# Patient Record
Sex: Male | Born: 1985 | Race: White | Hispanic: No | Marital: Married | Smoking: Never smoker
Health system: Southern US, Community
[De-identification: ages and names within clinical notes are randomized; demographics above are authoritative.]

## PROBLEM LIST (undated history)

## (undated) DIAGNOSIS — Q249 Congenital malformation of heart, unspecified: Secondary | ICD-10-CM

## (undated) DIAGNOSIS — M62838 Other muscle spasm: Secondary | ICD-10-CM

## (undated) HISTORY — DX: Other muscle spasm: M62.838

## (undated) HISTORY — DX: Congenital malformation of heart, unspecified: Q24.9

---

## 2010-12-04 ENCOUNTER — Emergency Department: Payer: Self-pay | Admitting: Emergency Medicine

## 2012-01-25 IMAGING — CR DG CHEST 2V
2 series · 2 of 2 positions shown · non-contrast
Comparison: none

REASON FOR EXAM: fevers, productive cough
COMMENTS:

PROCEDURE:     DXR - DXR CHEST PA (OR AP) AND LATERAL  - December 04, 2010  [DATE]
RESULT:     Comparison: None.

[pa]
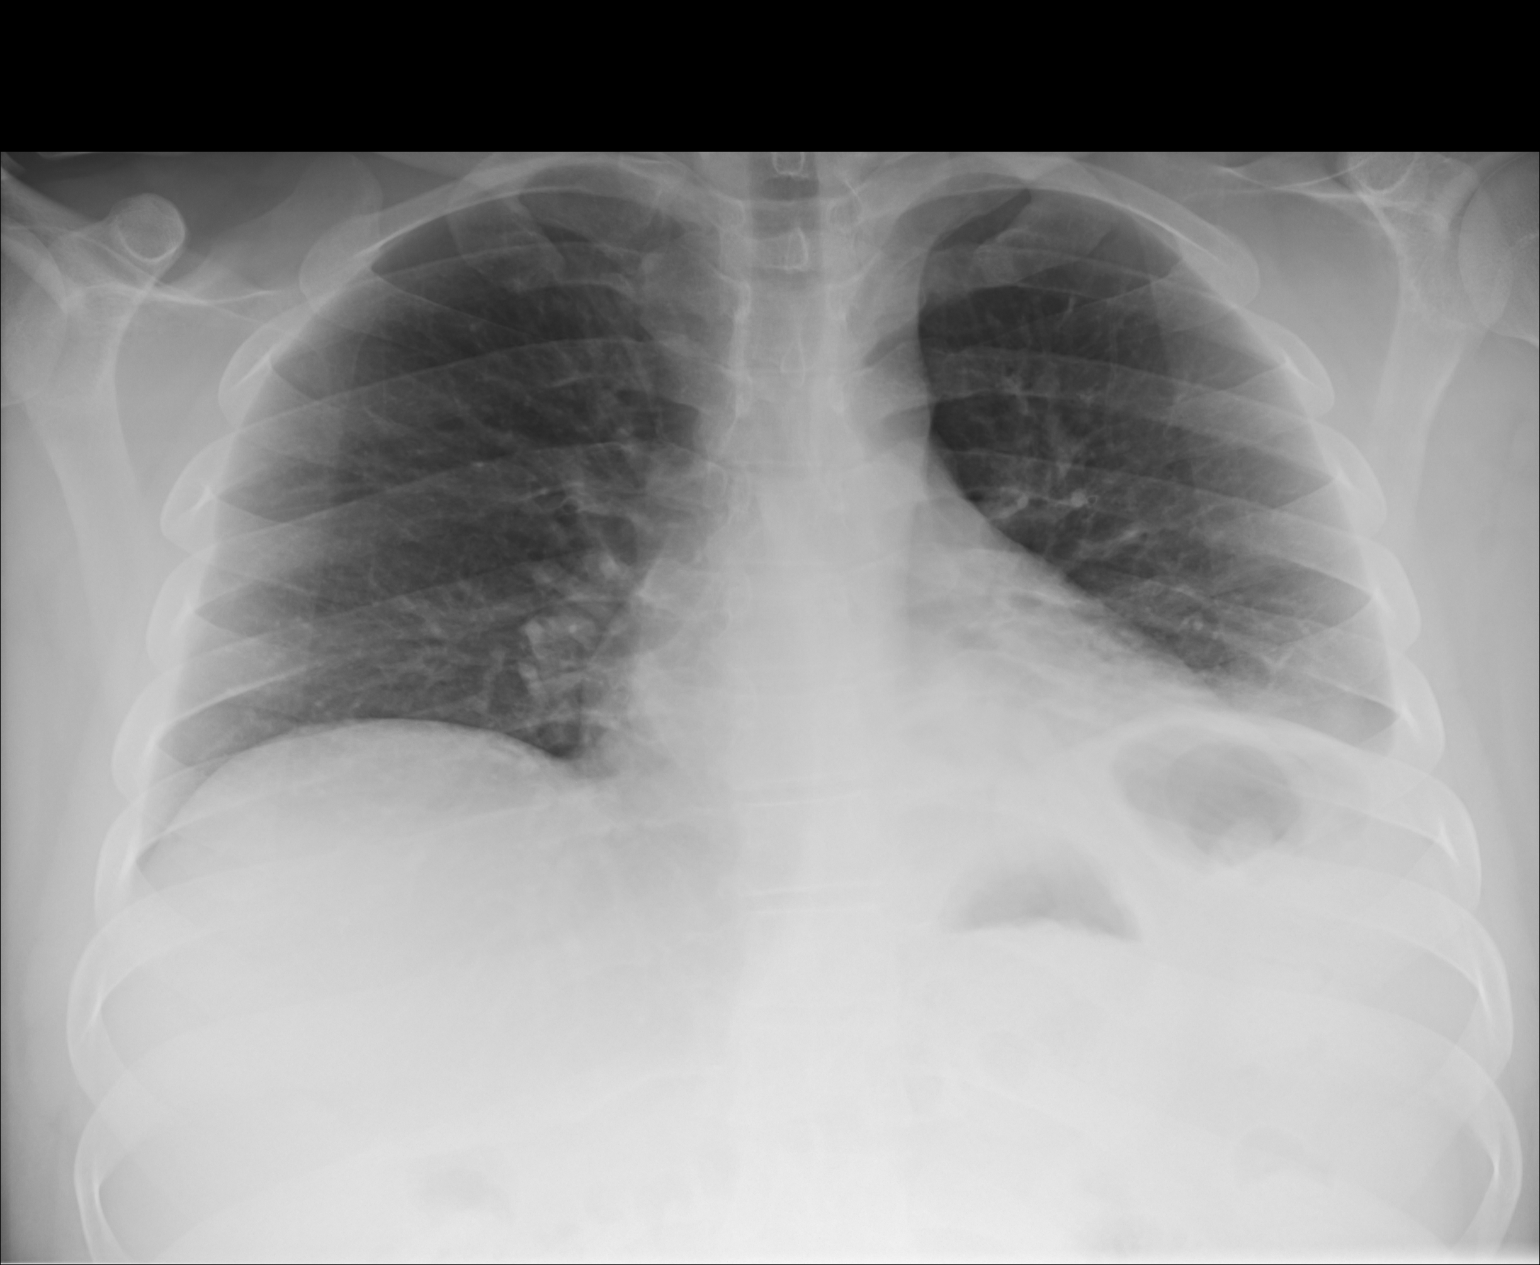

[lat]
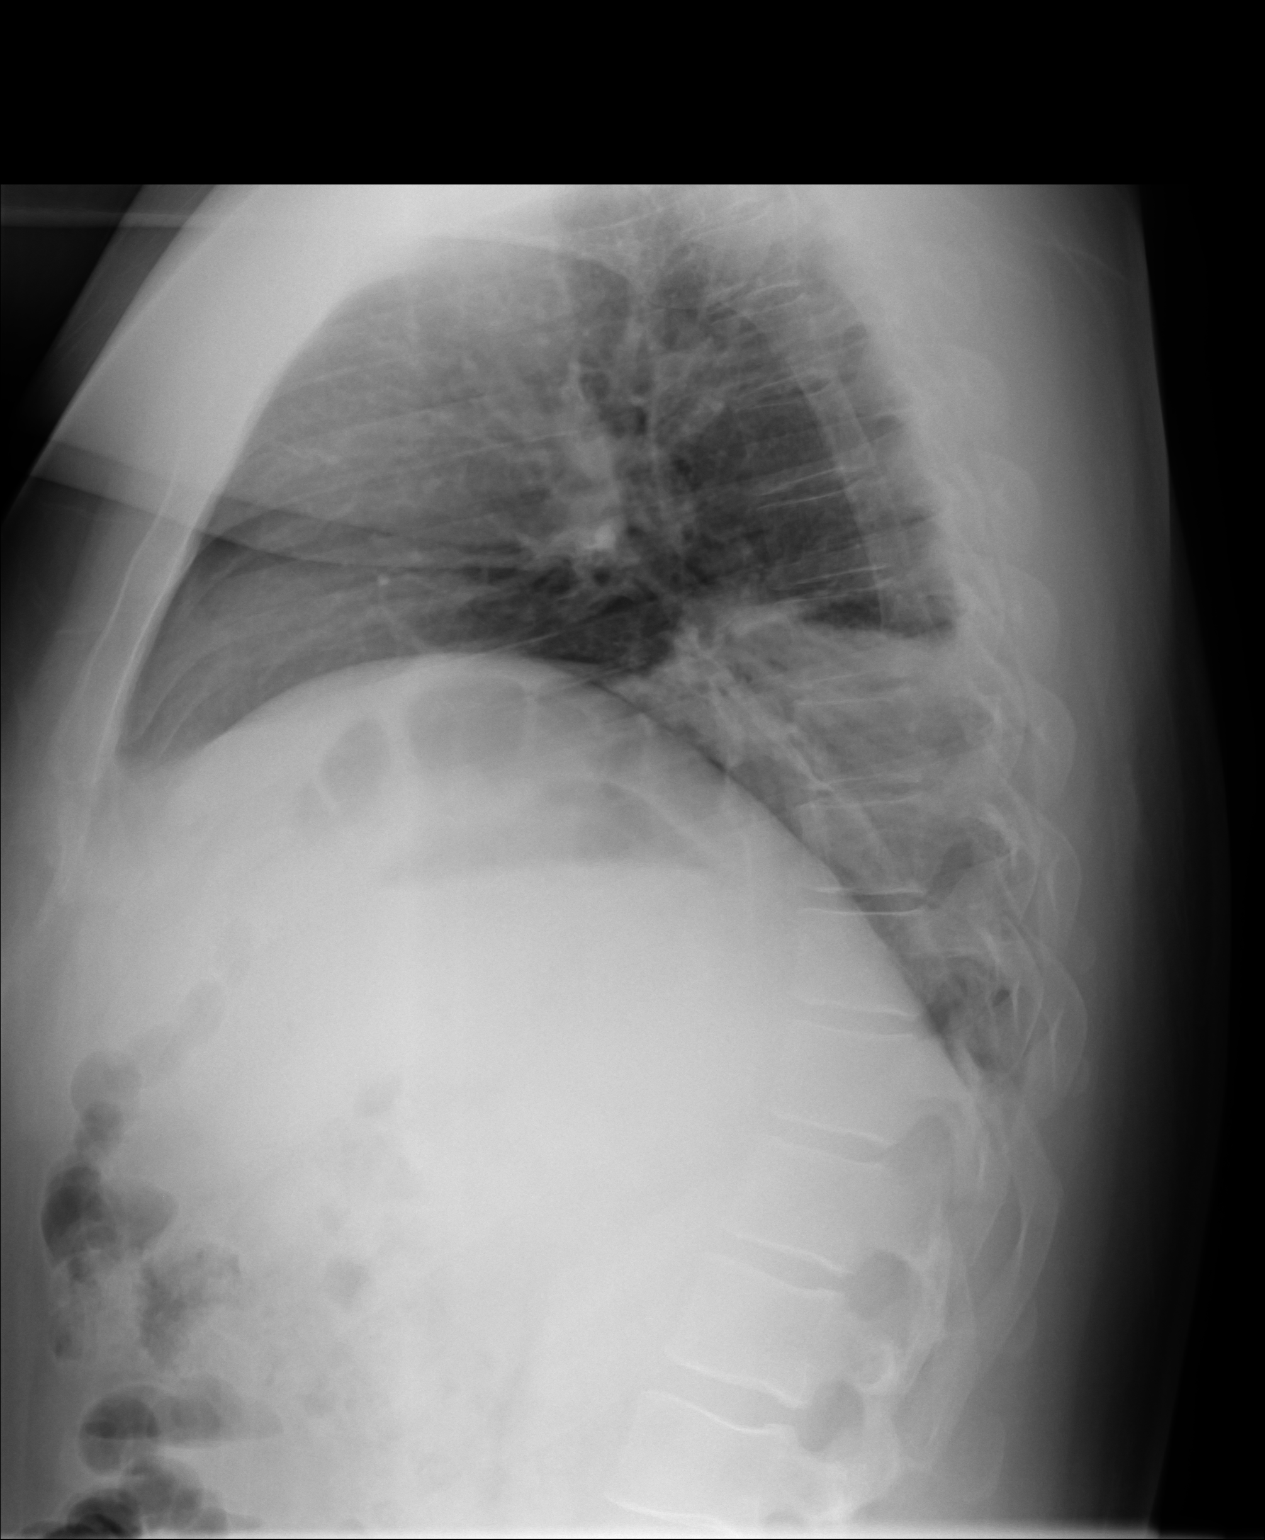

[2 of 2 positions shown; findings below may reference images not displayed]

FINDINGS: The lung volumes are low. Heart and mediastinum are within normal limits.
There is relatively homogeneous opacity in the left lower lobe, best seen on
the lateral view. The right lung is clear.
IMPRESSION: Left lower lobe pneumonia.

## 2018-04-18 ENCOUNTER — Other Ambulatory Visit: Payer: Self-pay | Admitting: Podiatry

## 2018-04-18 ENCOUNTER — Ambulatory Visit: Payer: BLUE CROSS/BLUE SHIELD | Admitting: Podiatry

## 2018-04-18 ENCOUNTER — Encounter: Payer: Self-pay | Admitting: Podiatry

## 2018-04-18 ENCOUNTER — Ambulatory Visit (INDEPENDENT_AMBULATORY_CARE_PROVIDER_SITE_OTHER): Payer: BLUE CROSS/BLUE SHIELD

## 2018-04-18 ENCOUNTER — Other Ambulatory Visit: Payer: Self-pay

## 2018-04-18 VITALS — BP 128/93 | HR 93 | Temp 97.7°F | Resp 16

## 2018-04-18 DIAGNOSIS — M7661 Achilles tendinitis, right leg: Secondary | ICD-10-CM | POA: Diagnosis not present

## 2018-04-18 DIAGNOSIS — M79672 Pain in left foot: Secondary | ICD-10-CM

## 2018-04-18 DIAGNOSIS — M7662 Achilles tendinitis, left leg: Secondary | ICD-10-CM

## 2018-04-18 DIAGNOSIS — M79671 Pain in right foot: Secondary | ICD-10-CM | POA: Diagnosis not present

## 2018-04-18 MED ORDER — TRIAMCINOLONE ACETONIDE 10 MG/ML IJ SUSP
10.0000 mg | Freq: Once | INTRAMUSCULAR | Status: AC
  Administered 2018-04-18: 10 mg

## 2018-04-18 NOTE — Patient Instructions (Signed)

## 2018-04-18 NOTE — Progress Notes (Signed)
   Subjective:    Patient ID: Stephen Ross, male    DOB: Mar 13, 1985, 33 y.o.   MRN: [id number]  HPI    Review of Systems  All other systems reviewed and are negative.      Objective:   Physical Exam        Assessment & Plan:

## 2018-04-18 NOTE — Progress Notes (Signed)
   Subjective:    Patient ID: Stephen Ross, male    DOB: June 29, 1985, 33 y.o.   MRN: [id number]  HPI    Review of Systems     Objective:   Physical Exam        Assessment & Plan:

## 2018-04-18 NOTE — Progress Notes (Signed)
Subjective:   Patient ID: Stephen Ross, male   DOB: 33 y.o.   MRN: [id number]   HPI Patient presents with a long-term pain in the posterior heel region left over right that is become more acute recently.  Patient is trying to lose weight and is obese and knows that he needs to drop weight but it is hard because of the pain in his heel and states that he has had trouble for years but it is just gotten worse as time is gone on.  Patient has been on anti-inflammatory which helped him to a minimal degree.  Patient does not smoke and likes to be active   Review of Systems  All other systems reviewed and are negative.       Objective:  Physical Exam Vitals signs and nursing note reviewed.  Constitutional:      Appearance: He is well-developed.  Pulmonary:     Effort: Pulmonary effort is normal.  Musculoskeletal: Normal range of motion.  Skin:    General: Skin is warm.  Neurological:     Mental Status: He is alert.     Neurovascular status found to be intact muscle strength is adequate range of motion within normal limits with patient found to have exquisite discomfort lateral side left posterior heel with inflammation fluid buildup and on the right I noted mild discomfort in the posterior heel.  Patient has severe equinus condition which is also contributory to his problem with obesity noted.  Patient has good digital perfusion well oriented x3     Assessment:  Posterior acute Achilles tendinitis left over right with patient also noted to have equinus condition bilateral which is contributory along with obesity     Plan:  H&P education rendered and discussed that at one point this may require surgery.  At this point will get a try to avoid surgery and I explained the procedure to him and at this time I went ahead and I explained chances for rupture associated with injection.  I did a careful lateral injection left 3 mg Dexasone Kenalog 5 mg Xylocaine applied air fracture walker for  complete rest and dispensed heel lifts.  Advised on ice reappoint 3 weeks or we may try the right and ultimately we may have to do surgical intervention along with Achilles tendon or gastroc lengthening  X-rays indicate there is relatively large spur formation posterior heel bilateral

## 2018-04-22 ENCOUNTER — Telehealth: Payer: Self-pay | Admitting: Podiatry

## 2018-04-22 NOTE — Telephone Encounter (Signed)
I informed Stephen Ross Dr. Charlsie Merles would want him to be in the boot until seen again in 2-4 weeks, except to sleep or shower. Stephen Ross asked if it was to still be painful after the injection and I told him on occasion there may be a flare of symptoms and treat with rest, ice and the antiinflammatory medication.

## 2018-04-22 NOTE — Telephone Encounter (Signed)
Pt seen Dr. Charlsie Merles on Monday. They received and injection and was put in a boot. Patient wanted to know how long they should have it on. Please call patient

## 2018-05-09 ENCOUNTER — Other Ambulatory Visit: Payer: Self-pay

## 2018-05-09 ENCOUNTER — Ambulatory Visit: Payer: BLUE CROSS/BLUE SHIELD | Admitting: Podiatry

## 2018-05-09 ENCOUNTER — Encounter: Payer: Self-pay | Admitting: Podiatry

## 2018-05-09 VITALS — Temp 98.8°F

## 2018-05-09 DIAGNOSIS — M7661 Achilles tendinitis, right leg: Secondary | ICD-10-CM | POA: Diagnosis not present

## 2018-05-09 DIAGNOSIS — M7662 Achilles tendinitis, left leg: Secondary | ICD-10-CM

## 2018-05-09 MED ORDER — TRIAMCINOLONE ACETONIDE 10 MG/ML IJ SUSP
10.0000 mg | Freq: Once | INTRAMUSCULAR | Status: AC
  Administered 2018-05-09: 10 mg

## 2018-05-09 NOTE — Progress Notes (Signed)
Subjective:   Patient ID: Stephen Ross, male   DOB: 33 y.o.   MRN: [id number]   HPI Patient presents stating right heel is some better but is still very tender and my right one has really been bothering me.  Patient realizes at one point this may need correction but he wants to try to see if the other 1 can be worked on and whether or not this will help his condition.  Patient at this point is aware surgery is a very distinct possibility   ROS      Objective:  Physical Exam  Neurovascular status intact with exquisite discomfort posterior aspect heel bilateral repaired though more intense on the right over left but both of them quite sore     Assessment:  Acute Achilles tendinitis right over left     Plan:  HEP condition reviewed and I discussed injection right explaining procedure and risk and he wants procedure.  I did a sterile prep the lateral side I injected the Achilles 3 mg Dexasone 5 g Xylocaine being careful not to put it into the central medial and I did discuss with him prior to doing it again like we had discussed previously the possibilities for rupture.  Patient will be seen back and understands may require surgery when it is also been done if symptoms get worse again

## 2018-06-01 ENCOUNTER — Other Ambulatory Visit: Payer: Self-pay

## 2018-06-01 ENCOUNTER — Encounter (INDEPENDENT_AMBULATORY_CARE_PROVIDER_SITE_OTHER): Payer: Self-pay

## 2018-06-01 ENCOUNTER — Encounter: Payer: Self-pay | Admitting: Podiatry

## 2018-06-01 ENCOUNTER — Ambulatory Visit (INDEPENDENT_AMBULATORY_CARE_PROVIDER_SITE_OTHER): Payer: BLUE CROSS/BLUE SHIELD | Admitting: Podiatry

## 2018-06-01 VITALS — Temp 98.1°F

## 2018-06-01 DIAGNOSIS — M7662 Achilles tendinitis, left leg: Secondary | ICD-10-CM

## 2018-06-01 DIAGNOSIS — M7661 Achilles tendinitis, right leg: Secondary | ICD-10-CM | POA: Diagnosis not present

## 2018-06-01 DIAGNOSIS — M216X2 Other acquired deformities of left foot: Secondary | ICD-10-CM

## 2018-06-01 DIAGNOSIS — M216X1 Other acquired deformities of right foot: Secondary | ICD-10-CM | POA: Diagnosis not present

## 2018-06-01 NOTE — Patient Instructions (Signed)
Pre-Operative Instructions  Congratulations, you have decided to take an important step towards improving your quality of life.  You can be assured that the doctors and staff at [facility] will be with you every step of the way.  Here are some important things you should know:  1. Plan to be at the surgery center/hospital at least 1 (one) hour prior to your scheduled time, unless otherwise directed by the surgical center/hospital staff.  You must have a responsible adult accompany you, remain during the surgery and drive you home.  Make sure you have directions to the surgical center/hospital to ensure you arrive on time. 2. If you are having surgery at [facility] or [facility] hospitals, you will need a copy of your medical history and physical form from your family physician within one month prior to the date of surgery. We will give you a form for your primary physician to complete.  3. We make every effort to accommodate the date you request for surgery.  However, there are times where surgery dates or times have to be moved.  We will contact you as soon as possible if a change in schedule is required.   4. No aspirin/ibuprofen for one week before surgery.  If you are on aspirin, any non-steroidal anti-inflammatory medications (Mobic, Aleve, Ibuprofen) should not be taken seven (7) days prior to your surgery.  You make take Tylenol for pain prior to surgery.  5. Medications - If you are taking daily heart and blood pressure medications, seizure, reflux, allergy, asthma, anxiety, pain or diabetes medications, make sure you notify the surgery center/hospital before the day of surgery so they can tell you which medications you should take or avoid the day of surgery. 6. No food or drink after midnight the night before surgery unless directed otherwise by surgical center/hospital staff. 7. No alcoholic beverages 24-hours prior to surgery.  No smoking 24-hours prior or 24-hours after  surgery. 8. Wear loose pants or shorts. They should be loose enough to fit over bandages, boots, and casts. 9. Don't wear slip-on shoes. Sneakers are preferred. 10. Bring your boot with you to the surgery center/hospital.  Also bring crutches or a walker if your physician has prescribed it for you.  If you do not have this equipment, it will be provided for you after surgery. 11. If you have not been contacted by the surgery center/hospital by the day before your surgery, call to confirm the date and time of your surgery. 12. Leave-time from work may vary depending on the type of surgery you have.  Appropriate arrangements should be made prior to surgery with your employer. 13. Prescriptions will be provided immediately following surgery by your doctor.  Fill these as soon as possible after surgery and take the medication as directed. Pain medications will not be refilled on weekends and must be approved by the doctor. 14. Remove nail polish on the operative foot and avoid getting pedicures prior to surgery. 15. Wash the night before surgery.  The night before surgery wash the foot and leg well with water and the antibacterial soap provided. Be sure to pay special attention to beneath the toenails and in between the toes.  Wash for at least three (3) minutes. Rinse thoroughly with water and dry well with a towel.  Perform this wash unless told not to do so by your physician.  Enclosed: 1 Ice pack (please put in freezer the night before surgery)   1 Hibiclens skin cleaner     Pre-op instructions  If you have any questions regarding the instructions, please do not hesitate to call our office.  [facility]: [address], [place], [place] [address] -- [phone]  [place]: [address] [place], [place] [address] -- [phone]  [facility]: [address]  [place], [place] [address] -- [phone]  [place]: [address], [address], [place], [place] [address] -- [phone]  Website:  [email or web address] 

## 2018-06-01 NOTE — Progress Notes (Signed)
Subjective:   Patient ID: Stephen Ross, male   DOB: 33 y.o.   MRN: [id number]   HPI Patient presents stating the back of my heel is still very sore and I cannot take the pain anymore and I need to get this taken care of.  Patient states that it is worse on the left one and it does not allow him to be active does not allow him to wear shoe gear with any degree of comfort and he wants to have this fixed as he is off of work for the next several months because of the virus.   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  At this point I have recommended tenolysis with removal of posterior spur reapplication with anchor system and gastroc lengthening.  I explained the procedure to Tyrique at great length and allowed him to review the consent form going over alternative treatments complications.  I did discuss he will be nonweightbearing a minimum 4 weeks in total recovery.  Will take 6 months to 1 year and the chances for posterior dehiscence are present and that is a risk factor of this procedure.  He wants surgery and after extensive review signed consent form and is given on preoperative instructions and is scheduled next week for procedure and is encouraged to call with any questions concerns he may half

## 2018-06-07 ENCOUNTER — Encounter: Payer: Self-pay | Admitting: Podiatry

## 2018-06-07 DIAGNOSIS — M7732 Calcaneal spur, left foot: Secondary | ICD-10-CM | POA: Diagnosis not present

## 2018-06-07 DIAGNOSIS — M216X2 Other acquired deformities of left foot: Secondary | ICD-10-CM

## 2018-06-07 DIAGNOSIS — Q6612 Congenital talipes calcaneovarus, left foot: Secondary | ICD-10-CM | POA: Diagnosis not present

## 2018-06-09 ENCOUNTER — Telehealth: Payer: Self-pay | Admitting: *Deleted

## 2018-06-09 NOTE — Telephone Encounter (Signed)
I informed pt of Dr. Beverlee Nims instructions and I also informed pt he could remove the boot, open-ended sock, ace wrap only then elevate the foot for 15 minutes, but if pain increased then dangle for 15 minutes, after 15 minutes place foot level with hip and rewrap the ace looser beginning at the toes rolling up the leg and reapply the sock and boot when sleeping and walking. Pt states understanding.

## 2018-06-09 NOTE — Telephone Encounter (Addendum)
Pt states he had left heel surgery 06/07/2018 and Dr. Charlsie Merles stated do not let his pain get over a 5, it is a 4 aching pain now on the percocet and ibuprofen and not getting relief.

## 2018-06-09 NOTE — Telephone Encounter (Signed)
Dr. Charlsie Merles states pt had a very big surgery, pt will need to take the pain medication as ordered and ibuprofen 800mg  tid, remove the cam boot periodically for comfort, and rest, ice and elevate.

## 2018-06-13 ENCOUNTER — Ambulatory Visit (INDEPENDENT_AMBULATORY_CARE_PROVIDER_SITE_OTHER): Payer: BC Managed Care – PPO

## 2018-06-13 ENCOUNTER — Ambulatory Visit (INDEPENDENT_AMBULATORY_CARE_PROVIDER_SITE_OTHER): Payer: BC Managed Care – PPO | Admitting: Podiatry

## 2018-06-13 ENCOUNTER — Other Ambulatory Visit: Payer: Self-pay

## 2018-06-13 VITALS — Temp 97.8°F

## 2018-06-13 DIAGNOSIS — Z09 Encounter for follow-up examination after completed treatment for conditions other than malignant neoplasm: Secondary | ICD-10-CM

## 2018-06-13 DIAGNOSIS — M216X2 Other acquired deformities of left foot: Secondary | ICD-10-CM

## 2018-06-13 DIAGNOSIS — M7662 Achilles tendinitis, left leg: Secondary | ICD-10-CM | POA: Diagnosis not present

## 2018-06-13 DIAGNOSIS — M216X1 Other acquired deformities of right foot: Secondary | ICD-10-CM

## 2018-06-13 DIAGNOSIS — M7661 Achilles tendinitis, right leg: Secondary | ICD-10-CM

## 2018-06-13 MED ORDER — ONDANSETRON HCL 4 MG PO TABS: 4.0000 mg | ORAL_TABLET | Freq: Three times a day (TID) | ORAL | 0 refills | Status: DC | PRN

## 2018-06-13 MED ORDER — HYDROCODONE-ACETAMINOPHEN 10-325 MG PO TABS: 1.0000 | ORAL_TABLET | ORAL | 0 refills | Status: AC | PRN

## 2018-06-13 NOTE — Progress Notes (Signed)
Subjective:   Patient ID: Stephen Ross, male   DOB: 33 y.o.   MRN: [id number]   HPI Patient presents stating that he has nausea and he had areas of pain but overall he seems to gradually be getting better and is very pleased with his surgery.  Patient states he has been staying nonweightbearing but he did have an incident on the second day where he did put a lot of pressure on his foot as he was trying to walk around with his crutches   ROS      Objective:  Physical Exam  Neurovascular status intact negative Homans sign noted with patient's left posterior heel healing well with wound edges well coapted and the incision on the calf muscle healing well with wound edges well coapted.  Negative Homans sign was noted again.  I did check and found that the strength of the Achilles tendon seems normal and the tendon appears to be functioning     Assessment:  Doing well post Achilles tendon repair along with posterior Achilles section and gastrocnemius recession     Plan:  H&P conditions reviewed and at this point reapply sterile dressing after reviewing x-ray and advised on continued elevation I did write him a prescription for hydrocodone and Zofran and he will be seen back in approximately 10 days suture removal or earlier if needed  X-rays indicate satisfactory resection ago with good positional component noted

## 2018-06-21 ENCOUNTER — Other Ambulatory Visit: Payer: Self-pay | Admitting: Sports Medicine

## 2018-06-21 ENCOUNTER — Telehealth: Payer: Self-pay | Admitting: *Deleted

## 2018-06-21 DIAGNOSIS — Z09 Encounter for follow-up examination after completed treatment for conditions other than malignant neoplasm: Secondary | ICD-10-CM

## 2018-06-21 MED ORDER — HYDROCODONE-ACETAMINOPHEN 10-325 MG PO TABS: 1.0000 | ORAL_TABLET | Freq: Four times a day (QID) | ORAL | 0 refills | Status: AC | PRN

## 2018-06-21 MED ORDER — ONDANSETRON HCL 4 MG PO TABS: 4.0000 mg | ORAL_TABLET | Freq: Three times a day (TID) | ORAL | 0 refills | Status: DC | PRN

## 2018-06-21 NOTE — Progress Notes (Signed)
Refilled Norco for post op pain

## 2018-06-21 NOTE — Telephone Encounter (Signed)
Sent!

## 2018-06-21 NOTE — Telephone Encounter (Signed)
I informed pt the of Dr. Leeanne Rio orders and I sent the Zofran to [facility].

## 2018-06-21 NOTE — Telephone Encounter (Signed)
Pt request refill of the pain medication and nausea medicine.

## 2018-06-23 ENCOUNTER — Encounter: Payer: Self-pay | Admitting: Podiatry

## 2018-06-23 ENCOUNTER — Ambulatory Visit (INDEPENDENT_AMBULATORY_CARE_PROVIDER_SITE_OTHER): Payer: BC Managed Care – PPO | Admitting: Podiatry

## 2018-06-23 ENCOUNTER — Telehealth: Payer: Self-pay | Admitting: *Deleted

## 2018-06-23 ENCOUNTER — Other Ambulatory Visit: Payer: Self-pay

## 2018-06-23 VITALS — Temp 98.0°F

## 2018-06-23 DIAGNOSIS — Z09 Encounter for follow-up examination after completed treatment for conditions other than malignant neoplasm: Secondary | ICD-10-CM

## 2018-06-23 DIAGNOSIS — M7662 Achilles tendinitis, left leg: Secondary | ICD-10-CM

## 2018-06-23 DIAGNOSIS — M7661 Achilles tendinitis, right leg: Secondary | ICD-10-CM

## 2018-06-23 DIAGNOSIS — R600 Localized edema: Secondary | ICD-10-CM

## 2018-06-23 NOTE — Progress Notes (Signed)
Subjective:   Patient ID: Stephen Ross, male   DOB: 33 y.o.   MRN: [id number]   HPI Patient states doing well but does have some swelling in the foot and is not ready to get his stitches out yet.  Patient also states that his right heel is hurting a lot he will probably have to get that fixed   ROS      Objective:  Physical Exam  Neurovascular status intact negative Homans sign was noted with incision sites intact posterior aspect left with quite a bit of edema around the ankle +1 pitting and into the midfoot.  It is quite sore when pressed     Assessment:  Abnormal edema of the left foot and ankle with overall what appears to be excellent healing with good alignment and no other pathology noted     Plan:  H&P condition reviewed and went ahead and applied Unna boot Ace wrap and continue boot usage and reappoint 1 week suture removal and we will hopefully get his stitches out and at that time will be reevaluated signed visit

## 2018-06-23 NOTE — Telephone Encounter (Signed)
Pt called states some of the toes are still numb and it has been about 2 weeks since the surgery, he has loosened the dressing. I spoke with pt and asked if the toes were the same temperature as his other toes and if they returned to color quickly. Pt states the toes seem to be the same temperature and color as the rest. Pt stated he has an appt tomorrow, but just wanted to know if this was normal. I told pt with the circulation vasculature so small in the toes, how the anesthesia left the toes was gradual and often fluctuated and could go on for several months. Pt states understanding.

## 2018-06-30 ENCOUNTER — Ambulatory Visit (INDEPENDENT_AMBULATORY_CARE_PROVIDER_SITE_OTHER): Payer: Self-pay | Admitting: Podiatry

## 2018-06-30 ENCOUNTER — Encounter: Payer: Self-pay | Admitting: Podiatry

## 2018-06-30 ENCOUNTER — Other Ambulatory Visit: Payer: Self-pay

## 2018-06-30 DIAGNOSIS — M216X2 Other acquired deformities of left foot: Secondary | ICD-10-CM

## 2018-06-30 DIAGNOSIS — M7661 Achilles tendinitis, right leg: Secondary | ICD-10-CM

## 2018-06-30 DIAGNOSIS — M7662 Achilles tendinitis, left leg: Secondary | ICD-10-CM

## 2018-06-30 DIAGNOSIS — M216X1 Other acquired deformities of right foot: Secondary | ICD-10-CM

## 2018-07-03 NOTE — Progress Notes (Signed)
Subjective:   Patient ID: Stephen Ross, male   DOB: 33 y.o.   MRN: [id number]   HPI Patient states I continue to improve and I am very pleased so far with minimal posterior pain and I want to get the right one done in a relative soon fashion   ROS      Objective:  Physical Exam  Neurovascular status intact negative Homans sign noted stitches intact in the left heel and calf muscle with incision site healing well with no breakdown drainage noted and diminishment of discomfort when palpated     Assessment:  Doing well post heel resection left and gastroc recession left     Plan:  H&P condition reviewed stitches removed sterile dressing reapplied discussed gradual mobilization but only in boot and reappoint 2 weeks to recheck.  Also begin physical therapy at this time and hopeful we will be able to do the right heel in 3 to 4 weeks

## 2018-07-18 ENCOUNTER — Encounter: Payer: Self-pay | Admitting: Podiatry

## 2018-07-18 ENCOUNTER — Telehealth: Payer: Self-pay | Admitting: Podiatry

## 2018-07-18 ENCOUNTER — Ambulatory Visit (INDEPENDENT_AMBULATORY_CARE_PROVIDER_SITE_OTHER): Payer: Self-pay

## 2018-07-18 ENCOUNTER — Other Ambulatory Visit: Payer: Self-pay

## 2018-07-18 ENCOUNTER — Ambulatory Visit (INDEPENDENT_AMBULATORY_CARE_PROVIDER_SITE_OTHER): Payer: Self-pay | Admitting: Podiatry

## 2018-07-18 VITALS — Temp 97.6°F

## 2018-07-18 DIAGNOSIS — M779 Enthesopathy, unspecified: Secondary | ICD-10-CM

## 2018-07-18 DIAGNOSIS — R600 Localized edema: Secondary | ICD-10-CM

## 2018-07-18 MED ORDER — CEPHALEXIN 500 MG PO CAPS: 500.0000 mg | ORAL_CAPSULE | Freq: Three times a day (TID) | ORAL | 1 refills | Status: DC

## 2018-07-18 MED ORDER — HYDROCODONE-ACETAMINOPHEN 10-325 MG PO TABS: 1.0000 | ORAL_TABLET | Freq: Three times a day (TID) | ORAL | 0 refills | Status: AC | PRN

## 2018-07-18 NOTE — Telephone Encounter (Signed)
I called pt and asked pt to describe status of the incision. Pt states the bottom of his incision is open enough to put his pinkie in and has cloudy drainage. I offered pt an appt today at 3:15pm and pt accepted.

## 2018-07-18 NOTE — Telephone Encounter (Signed)
Patient is having some problems with his sx foot. He states that there is a hole at the bottom of the incision. Just wanted to know what to do

## 2018-07-18 NOTE — Progress Notes (Signed)
Subjective:   Patient ID: Stephen Ross, male   DOB: 33 y.o.   MRN: [id number]   HPI Patient states that he had a little scab that he took off himself and he was just concerned he wanted to get it checked.  Patient states that overall he has been doing well and starting to put weight on his foot   ROS      Objective:  Physical Exam  Neurovascular status intact negative Homans sign noted with good range of motion of the Achilles left.  There is a small area on the plantar surface of the incision measuring about 7 mm in length by 5 mm in width that is crusted over with no drainage no erythema noted with no other pathology noted currently no swelling noted currently     Assessment:  Overall doing well with a slight bit of crusted tissue in the most distal portion of the left Achilles incision site     Plan:  H&P discussed this and he understands that this was a possibility due to his obesity and the stress occurring against this area.  I instructed him on wet-to-dry dressings using Betadine and instructed him on gently using a 2 x 2 on the dressing on the area and that if any redness any drainage or any other pathology were to occur to let us know immediately and this should heal uneventfully.  As precautionary measure I placed him on cephalexin 500 mg 3 times daily and gave instructions on continued elevation and compression.  Reappoint to recheck  X-ray indicated satisfactory bone resection no indications of ostial lysis signed visit

## 2018-07-20 ENCOUNTER — Other Ambulatory Visit: Payer: BC Managed Care – PPO

## 2018-08-08 ENCOUNTER — Ambulatory Visit (INDEPENDENT_AMBULATORY_CARE_PROVIDER_SITE_OTHER): Payer: Self-pay | Admitting: Podiatry

## 2018-08-08 ENCOUNTER — Other Ambulatory Visit: Payer: Self-pay

## 2018-08-08 ENCOUNTER — Encounter: Payer: Self-pay | Admitting: Podiatry

## 2018-08-08 ENCOUNTER — Ambulatory Visit (INDEPENDENT_AMBULATORY_CARE_PROVIDER_SITE_OTHER): Payer: Self-pay

## 2018-08-08 DIAGNOSIS — M779 Enthesopathy, unspecified: Secondary | ICD-10-CM

## 2018-08-08 DIAGNOSIS — L02619 Cutaneous abscess of unspecified foot: Secondary | ICD-10-CM

## 2018-08-08 DIAGNOSIS — L03119 Cellulitis of unspecified part of limb: Secondary | ICD-10-CM

## 2018-08-08 MED ORDER — HYDROCODONE-ACETAMINOPHEN 10-325 MG PO TABS: 1.0000 | ORAL_TABLET | ORAL | 0 refills | Status: AC | PRN

## 2018-08-10 NOTE — Progress Notes (Signed)
Subjective:   Patient ID: Stephen Ross, male   DOB: 33 y.o.   MRN: [id number]   HPI Patient states overall doing well but he does have discomfort where there is been a breakdown of tissue on the most distal portion of the posterior incision site.  His wife is with him today   ROS      Objective:  Physical Exam  Neurovascular status unchanged with good range of motion of the Achilles tendon left with an area of breakdown of tissue in the most distal aspect of the incision site measuring about 1.5 cm in length by 8 mm in width with approximate 5 cm in depth with no drainage no redness but there is some tissue in the area     Assessment:  Distal dehiscence of the incision site left which does not appear to be infected but is still not healing at an effective rate     Plan:  H&P x-ray reviewed and I reviewed this case with Dr. Carman Ching who saw the patient also.  We are in agreement that we need to clean out the scar tissue and I did do a proximal nerve block and using sterile debridement technique I did clean this tissue out I then flushed the wound did not see any tendon exposure or subcutaneous exposure with no drainage and we did go ahead and I applied Iodosorb and we will consider Santyl or other chemical but currently will use Iodosorb and they will do wet-to-dry dressings with Betadine daily and we will reevaluate again next week with strict instructions of any redness drainage swelling or systemic signs of infection were to occur patient is to contact us immediately and go to the hospital  X-ray indicates satisfactory resection of bone with no signs of posterior bone pathology

## 2018-08-18 ENCOUNTER — Encounter: Payer: Self-pay | Admitting: Podiatry

## 2018-08-18 ENCOUNTER — Ambulatory Visit (INDEPENDENT_AMBULATORY_CARE_PROVIDER_SITE_OTHER): Payer: Self-pay | Admitting: Podiatry

## 2018-08-18 ENCOUNTER — Other Ambulatory Visit: Payer: Self-pay

## 2018-08-18 ENCOUNTER — Ambulatory Visit (INDEPENDENT_AMBULATORY_CARE_PROVIDER_SITE_OTHER): Payer: Self-pay

## 2018-08-18 VITALS — Temp 98.0°F

## 2018-08-18 DIAGNOSIS — M779 Enthesopathy, unspecified: Secondary | ICD-10-CM

## 2018-08-18 DIAGNOSIS — Z9889 Other specified postprocedural states: Secondary | ICD-10-CM

## 2018-08-18 NOTE — Progress Notes (Signed)
Subjective:   Patient ID: Stephen Ross, male   DOB: 33 y.o.   MRN: [id number]   HPI Patient states he seems to be improving and states there is still some swelling but he feels like the tissue is healing   ROS      Objective:  Physical Exam  Neurovascular status found to be intact with patient's left heel overall healing well except for the distal end where there is a small breakdown of tissue which measures approximately 6 x 6 mm but it does not appear to be as deep as it was previously and patient been using Iodosorb at home     Assessment:  Distal breakdown of tissue secondary to Achilles tendon repair gastroc recession that appears to be gradually healing but no erythema edema or drainage and no odor noted     Plan:  Appears to be continues to improve and I did have Dr. March Rummage evaluate patient with me and we are both in agreement that improvement is occurring.  Today I went ahead and we will start wet-to-dry dressings and I gave him all materials to do this and Iodosorb if there is any drainage which occurs.  I am very hopeful over the next couple weeks this will heal and I gave strict instructions of any changes were to occur or any redness drainage odor to let us know immediately and if not we will see back for regular visit 2 weeks

## 2018-09-01 ENCOUNTER — Encounter: Payer: Self-pay | Admitting: Podiatry

## 2018-09-01 ENCOUNTER — Ambulatory Visit (INDEPENDENT_AMBULATORY_CARE_PROVIDER_SITE_OTHER): Payer: Self-pay | Admitting: Podiatry

## 2018-09-01 ENCOUNTER — Other Ambulatory Visit: Payer: Self-pay

## 2018-09-01 ENCOUNTER — Ambulatory Visit (INDEPENDENT_AMBULATORY_CARE_PROVIDER_SITE_OTHER): Payer: Medicaid Other

## 2018-09-01 DIAGNOSIS — Z9889 Other specified postprocedural states: Secondary | ICD-10-CM

## 2018-09-01 DIAGNOSIS — M216X2 Other acquired deformities of left foot: Secondary | ICD-10-CM

## 2018-09-02 NOTE — Progress Notes (Signed)
Subjective:   Patient ID: Stephen Ross, male   DOB: 33 y.o.   MRN: [id number]   HPI Patient presents stating he feels like he is still improving and it is gradually feeling and continue to work on it at home   ROS      Objective:  Physical Exam  Neurovascular status intact with patient having done very well with a posterior calcaneal ostial lysis procedure with dehiscence which occurred in the distal portion of the incision site that they continue to work on he continues to improve     Assessment:  Posterior calcaneal procedure left but overall was given a good functional result with approximate 1 cm area of dehiscence on the most distal portion of the incision with no subcutaneous exposure and what appears to be gradual improvement of the tissue formation with still some eschar formation within the dehiscence site     Plan:  H&P continue conservative wet-to-dry dressings and at this time there is no indication of infection but I gave him all the signals to look for if any redness drainage or other pathology were to occur.  Continue with elevation and gradual increase in weightbearing and dispensed surgical shoe and reappoint 2 weeks or earlier if needed and hopefully at one point future will be able to do his other leg but at this point we want this area to heal first

## 2018-09-13 ENCOUNTER — Telehealth: Payer: Self-pay | Admitting: *Deleted

## 2018-09-13 NOTE — Telephone Encounter (Signed)
Pt called and request refill of the hydrocodone, he will be going out of town tomorrow.

## 2018-09-13 NOTE — Telephone Encounter (Signed)
I informed pt Dr. Paulla Dolly would like to know the extent of his pain. Pt states it comes and goes but can be a 7.5 and increases with activity. I told pt that where ever he went to take his surgery boot to go back in to if painful and I would inform Dr. Paulla Dolly of his request and for him to call the pharmacy tomorrow.

## 2018-09-13 NOTE — Telephone Encounter (Signed)
fine

## 2018-09-14 MED ORDER — HYDROCODONE-ACETAMINOPHEN 10-325 MG PO TABS: 1.0000 | ORAL_TABLET | Freq: Three times a day (TID) | ORAL | 0 refills | Status: AC | PRN

## 2018-09-14 NOTE — Addendum Note (Signed)
Addended by: Wallene Huh on: 09/14/2018 01:41 PM   Modules accepted: Orders

## 2018-09-15 ENCOUNTER — Encounter: Payer: Medicaid Other | Admitting: Podiatry

## 2018-09-19 ENCOUNTER — Ambulatory Visit (INDEPENDENT_AMBULATORY_CARE_PROVIDER_SITE_OTHER): Payer: Self-pay

## 2018-09-19 ENCOUNTER — Encounter: Payer: Self-pay | Admitting: Podiatry

## 2018-09-19 ENCOUNTER — Other Ambulatory Visit: Payer: Self-pay

## 2018-09-19 ENCOUNTER — Ambulatory Visit (INDEPENDENT_AMBULATORY_CARE_PROVIDER_SITE_OTHER): Payer: Self-pay | Admitting: Podiatry

## 2018-09-19 VITALS — Wt 342.0 lb

## 2018-09-19 DIAGNOSIS — M7752 Other enthesopathy of left foot: Secondary | ICD-10-CM

## 2018-09-19 DIAGNOSIS — Z9889 Other specified postprocedural states: Secondary | ICD-10-CM

## 2018-09-19 DIAGNOSIS — M779 Enthesopathy, unspecified: Secondary | ICD-10-CM

## 2018-09-19 NOTE — Progress Notes (Signed)
Subjective:   Patient ID: Stephen Ross, male   DOB: 33 y.o.   MRN: [id number]   HPI Patient states doing much better in him losing weight at this time   ROS      Objective:  Physical Exam  Neurovascular status intact with significant improvement of the posterior incision site left with it found to be filling and with only a small area on the dorsal surface     Assessment:  Appears to be making excellent progress with posterior wound left with loss of weight probably been beneficial     Plan:  Flush the area and continue wet-to-dry dressings and this should heal uneventfully and gave strict instructions of any redness drainage were to occur to let us know and reappoint 4 weeks and still may require surgery on the right but were hoping will be able to avoid  X-ray looks like everything is healing well with no indications of significant pathology

## 2018-10-17 ENCOUNTER — Encounter: Payer: Self-pay | Admitting: Podiatry

## 2018-10-17 ENCOUNTER — Ambulatory Visit (INDEPENDENT_AMBULATORY_CARE_PROVIDER_SITE_OTHER): Payer: Medicaid Other

## 2018-10-17 ENCOUNTER — Other Ambulatory Visit: Payer: Self-pay

## 2018-10-17 ENCOUNTER — Ambulatory Visit (INDEPENDENT_AMBULATORY_CARE_PROVIDER_SITE_OTHER): Payer: Self-pay | Admitting: Podiatry

## 2018-10-17 DIAGNOSIS — M779 Enthesopathy, unspecified: Secondary | ICD-10-CM

## 2018-10-24 NOTE — Progress Notes (Signed)
Subjective:   Patient ID: Stephen Ross, male   DOB: 33 y.o.   MRN: [id number]   HPI Patient presents stating he continues to improve and does still have some mild swelling but less pain and is gradually wearing shoes and knows he needs to get the right with fixed but cannot do at this time   ROS      Objective:  Physical Exam  Neurovascular status intact with patient's posterior left heel continuing to heal with 1 small area of crusted tissue on the plantar portion that is localized with no erythema no edema no drainage associated with it     Assessment:  Overall doing well left with one small area that needs to finish healing but continuing to improve     Plan:  Reviewed continued wet-to-dry dressings for the small area that measures about 5 x 5 mm and advised on gradual increase in activities but that this surgery does take approximately 1 year to heal completely and still will take some time before that arrives.  Patient will be seen back to recheck in several months or if any redness or increased drainage or opening of the incision were to occur  X-rays indicated satisfactory bone resection no indication for lysis

## 2022-02-03 ENCOUNTER — Telehealth: Payer: Self-pay

## 2022-02-03 NOTE — Telephone Encounter (Signed)
Mychart msg sent. AS, CMA 

## 2023-11-23 ENCOUNTER — Encounter: Payer: Self-pay | Admitting: Nurse Practitioner

## 2023-11-23 ENCOUNTER — Ambulatory Visit: Admitting: Nurse Practitioner

## 2023-11-23 VITALS — BP 133/82 | HR 73 | Temp 98.3°F | Ht 72.05 in | Wt 347.8 lb

## 2023-11-23 DIAGNOSIS — Z23 Encounter for immunization: Secondary | ICD-10-CM | POA: Diagnosis not present

## 2023-11-23 DIAGNOSIS — M7730 Calcaneal spur, unspecified foot: Secondary | ICD-10-CM

## 2023-11-23 DIAGNOSIS — Z7689 Persons encountering health services in other specified circumstances: Secondary | ICD-10-CM

## 2023-11-23 DIAGNOSIS — Z91018 Allergy to other foods: Secondary | ICD-10-CM | POA: Diagnosis not present

## 2023-11-23 DIAGNOSIS — Z136 Encounter for screening for cardiovascular disorders: Secondary | ICD-10-CM

## 2023-11-23 DIAGNOSIS — Z6841 Body Mass Index (BMI) 40.0 and over, adult: Secondary | ICD-10-CM

## 2023-11-23 DIAGNOSIS — Z1159 Encounter for screening for other viral diseases: Secondary | ICD-10-CM

## 2023-11-23 DIAGNOSIS — Z131 Encounter for screening for diabetes mellitus: Secondary | ICD-10-CM

## 2023-11-23 DIAGNOSIS — Z114 Encounter for screening for human immunodeficiency virus [HIV]: Secondary | ICD-10-CM

## 2023-11-23 NOTE — Assessment & Plan Note (Signed)
 Chronic.  Ongoing concern since he was a child.  Would like to avoid surgery at this time. Plans to work on diet and exercise. Will check labs at visit today.  Will follow up in 1 month. Can discuss initiation of weight loss medication at that time.

## 2023-11-23 NOTE — Progress Notes (Signed)
 BP 133/82 (BP Location: Left Arm, Cuff Size: Large)   Pulse 73   Temp 98.3 F (36.8 C) (Oral)   Ht 6' 0.05 (1.83 m)   Wt (!) 347 lb 12.8 oz (157.8 kg)   SpO2 97%   BMI 47.11 kg/m    Subjective:    Patient ID: Stephen Ross, male    DOB: [birth year], 38 y.o.   MRN: [id number]  HPI: Stephen Ross is a 38 y.o. male  Chief Complaint  Patient presents with   Establish Care    Patient stated he would like to talk about back pain, heel pain(he has a spur on both feet) he's been dealing with since teen years, muscles spasm.    Patient presents to clinic to establish care with new PCP.  Introduced to publishing rights manager role and practice setting.  All questions answered.  Discussed provider/patient relationship and expectations.  Patient reports a history of heel spurs since he was a teen.  Patient has not been seen by a PCP since he was a kid.  He did have surgery on his left foot for heel spurs.    Patient denies a history of: Hypertension, Elevated Cholesterol, Diabetes, Thyroid problems, Depression, Anxiety, Neurological problems, and Abdominal problems.    Patient states he has been struggling with heel spurs since he was a teen.  He had surgery June 09 2018 and the heel spur had already come back by the time he could walk.  Right after he had the surgery he was laid off during COVID.  He would like to avoid surgery.  Knows that his foot feels better when he gets his weight off.  He is working on diet so he can workout but he is having difficulty with weight loss.   States he has trouble with a food allergy.  Not sure what it is but every once in awhile he will have what feels like food poisoning and gets very sick.  Not sure what is causing it but would like to find out.      Active Ambulatory Problems    Diagnosis Date Noted   BMI 45.0-49.9, adult (HCC) 11/23/2023   Heel spur 11/23/2023   Resolved Ambulatory Problems    Diagnosis Date Noted   No Resolved Ambulatory Problems    Past Medical History:  Diagnosis Date   Heart abnormality    Muscle spasm    History reviewed. No pertinent surgical history. Family History  Problem Relation Age of Onset   Arthritis Mother    Kidney disease Father    Cancer Father    Diabetes Father      Review of Systems  Constitutional:  Positive for unexpected weight change.  Musculoskeletal:        Heel pain    Per HPI unless specifically indicated above     Objective:    BP 133/82 (BP Location: Left Arm, Cuff Size: Large)   Pulse 73   Temp 98.3 F (36.8 C) (Oral)   Ht 6' 0.05 (1.83 m)   Wt (!) 347 lb 12.8 oz (157.8 kg)   SpO2 97%   BMI 47.11 kg/m   Wt Readings from Last 3 Encounters:  11/23/23 (!) 347 lb 12.8 oz (157.8 kg)  09/19/18 (!) 342 lb (155.1 kg)    Physical Exam Vitals and nursing note reviewed.  Constitutional:      General: He is not in acute distress.    Appearance: Normal appearance. He is obese. He is not ill-appearing,  toxic-appearing or diaphoretic.  HENT:     Head: Normocephalic.     Right Ear: External ear normal.     Left Ear: External ear normal.     Nose: Nose normal. No congestion or rhinorrhea.     Mouth/Throat:     Mouth: Mucous membranes are moist.  Eyes:     General:        Right eye: No discharge.        Left eye: No discharge.     Extraocular Movements: Extraocular movements intact.     Conjunctiva/sclera: Conjunctivae normal.     Pupils: Pupils are equal, round, and reactive to light.  Cardiovascular:     Rate and Rhythm: Normal rate and regular rhythm.     Heart sounds: No murmur heard. Pulmonary:     Effort: Pulmonary effort is normal. No respiratory distress.     Breath sounds: Normal breath sounds. No wheezing, rhonchi or rales.  Abdominal:     General: Abdomen is flat. Bowel sounds are normal.  Musculoskeletal:     Cervical back: Normal range of motion and neck supple.  Skin:    General: Skin is warm and dry.     Capillary Refill: Capillary refill  takes less than 2 seconds.  Neurological:     General: No focal deficit present.     Mental Status: He is alert and oriented to person, place, and time.  Psychiatric:        Mood and Affect: Mood normal.        Behavior: Behavior normal.        Thought Content: Thought content normal.        Judgment: Judgment normal.     No results found for this or any previous visit.    Assessment & Plan:   Problem List Items Addressed This Visit       Other   BMI 45.0-49.9, adult (HCC) - Primary   Recommended eating smaller high protein, low fat meals more frequently and exercising 30 mins a day 5 times a week with a goal of 10-15lb weight loss in the next 3 months.        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  Would like to avoid surgery at this time. Plans to work on diet and exercise. Will check labs at visit today.  Will follow up in 1 month. Can discuss initiation of weight loss medication at that time.      Other Visit Diagnoses       Food allergy       Will check basic food allergy panel at visit today.  Can order further labs in the future if needed.   Relevant Orders   CBC w/Diff   Allergen Profile, Basic Food ([facility])     Flu vaccine need         Screening for ischemic heart disease       Relevant Orders   Lipid panel     Encounter for hepatitis C screening test for low risk patient       Relevant Orders   Hepatitis C antibody     Screening for HIV (human immunodeficiency virus)       Relevant Orders   HIV Antibody (routine testing w rflx)     Screening for diabetes mellitus       Relevant Orders   Comp Met (CMET)   HgB A1c     Encounter to establish care  Follow up plan: Return in about 1 month (around 12/23/2023) for Weight Managment.

## 2023-11-23 NOTE — Assessment & Plan Note (Signed)
 Recommended eating smaller high protein, low fat meals more frequently and exercising 30 mins a day 5 times a week with a goal of 10-15lb weight loss in the next 3 months.

## 2023-11-26 ENCOUNTER — Ambulatory Visit: Payer: Self-pay | Admitting: Nurse Practitioner

## 2023-11-26 LAB — CBC WITH DIFFERENTIAL/PLATELET
Basophils Absolute: 0.1 x10E3/uL (ref 0.0–0.2)
Basos: 1 %
EOS (ABSOLUTE): 0.1 x10E3/uL (ref 0.0–0.4)
Eos: 1 %
Hematocrit: 44.3 % (ref 37.5–51.0)
Hemoglobin: 14.4 g/dL (ref 13.0–17.7)
Immature Grans (Abs): 0 x10E3/uL (ref 0.0–0.1)
Immature Granulocytes: 0 %
Lymphocytes Absolute: 2.8 x10E3/uL (ref 0.7–3.1)
Lymphs: 28 %
MCH: 30.8 pg (ref 26.6–33.0)
MCHC: 32.5 g/dL (ref 31.5–35.7)
MCV: 95 fL (ref 79–97)
Monocytes Absolute: 0.7 x10E3/uL (ref 0.1–0.9)
Monocytes: 7 %
Neutrophils Absolute: 6.3 x10E3/uL (ref 1.4–7.0)
Neutrophils: 63 %
Platelets: 324 x10E3/uL (ref 150–450)
RBC: 4.68 x10E6/uL (ref 4.14–5.80)
RDW: 13 % (ref 11.6–15.4)
WBC: 9.9 x10E3/uL (ref 3.4–10.8)

## 2023-11-26 LAB — ALLERGENS(14)
Allergen Corn, IgE: <0.1 kU/L
Allergen Salmon IgE: <0.1 kU/L
Beef IgE: <0.1 kU/L
Chocolate/Cacao IgE: <0.1 kU/L
Codfish IgE: <0.1 kU/L
Egg, Whole IgE: <0.1 kU/L
F037-IgE Mussel: <0.1 kU/L
Milk IgE: <0.1 kU/L
Peanut IgE: <0.1 kU/L
Pork IgE: <0.1 kU/L
Shrimp IgE: <0.1 kU/L
Soybean IgE: <0.1 kU/L
Tuna: <0.1 kU/L
Wheat IgE: <0.1 kU/L

## 2023-11-26 LAB — COMPREHENSIVE METABOLIC PANEL WITH GFR
ALT: 36 IU/L (ref 0–44)
AST: 19 IU/L (ref 0–40)
Albumin: 4.4 g/dL (ref 4.1–5.1)
Alkaline Phosphatase: 73 IU/L (ref 47–123)
BUN/Creatinine Ratio: 21 — ABNORMAL HIGH (ref 9–20)
BUN: 18 mg/dL (ref 6–20)
Bilirubin Total: 0.4 mg/dL (ref 0.0–1.2)
CO2: 23 mmol/L (ref 20–29)
Calcium: 9.4 mg/dL (ref 8.7–10.2)
Chloride: 103 mmol/L (ref 96–106)
Creatinine, Ser: 0.85 mg/dL (ref 0.76–1.27)
Globulin, Total: 2.3 g/dL (ref 1.5–4.5)
Glucose: 92 mg/dL (ref 70–99)
Potassium: 4.6 mmol/L (ref 3.5–5.2)
Sodium: 140 mmol/L (ref 134–144)
Total Protein: 6.7 g/dL (ref 6.0–8.5)
eGFR: 115 mL/min/1.73 (ref >59)

## 2023-11-26 LAB — HEMOGLOBIN A1C
Est. average glucose Bld gHb Est-mCnc: 103 mg/dL
Hgb A1c MFr Bld: 5.2 % (ref 4.8–5.6)

## 2023-11-26 LAB — LIPID PANEL
Chol/HDL Ratio: 4.8 ratio (ref 0.0–5.0)
Cholesterol, Total: 178 mg/dL (ref 100–199)
HDL: 37 mg/dL — ABNORMAL LOW (ref >39)
LDL Chol Calc (NIH): 116 mg/dL — ABNORMAL HIGH (ref 0–99)
Triglycerides: 138 mg/dL (ref 0–149)
VLDL Cholesterol Cal: 25 mg/dL (ref 5–40)

## 2023-11-26 LAB — HEPATITIS C ANTIBODY: Hep C Virus Ab: NONREACTIVE

## 2023-11-26 LAB — HIV ANTIBODY (ROUTINE TESTING W REFLEX): HIV Screen 4th Generation wRfx: NONREACTIVE

## 2023-12-28 ENCOUNTER — Ambulatory Visit: Admitting: Nurse Practitioner

## 2023-12-28 ENCOUNTER — Encounter: Payer: Self-pay | Admitting: Nurse Practitioner

## 2023-12-28 VITALS — BP 127/76 | HR 86 | Temp 98.1°F | Ht 72.05 in | Wt 353.0 lb

## 2023-12-28 DIAGNOSIS — G8929 Other chronic pain: Secondary | ICD-10-CM

## 2023-12-28 DIAGNOSIS — M545 Low back pain, unspecified: Secondary | ICD-10-CM

## 2023-12-28 DIAGNOSIS — Z6841 Body Mass Index (BMI) 40.0 and over, adult: Secondary | ICD-10-CM

## 2023-12-28 NOTE — Assessment & Plan Note (Signed)
 Recommended eating smaller high protein, low fat meals more frequently and exercising 30 mins a day 5 times a week with a goal of 10-15lb weight loss in the next 3 months.

## 2023-12-28 NOTE — Progress Notes (Signed)
 "  BP 127/76 (BP Location: Left Arm, Patient Position: Sitting, Cuff Size: Large)   Pulse 86   Temp 98.1 F (36.7 C) (Oral)   Ht 6' 0.05 (1.83 m)   Wt (!) 353 lb (160.1 kg)   SpO2 97%   BMI 47.81 kg/m    Subjective:    Patient ID: Stephen Ross, male    DOB: 1985/07/06, 38 y.o.   MRN: [id number]  HPI: Stephen Ross is a 38 y.o. male  Chief Complaint  Patient presents with   Office visit    1 month F/u. Patient stated there is pain in neck and heels and it's not getting better.    Patient states he continues to have heel pain and neck pain.  He had a work place injury with his back and he thought it was gone.  He feels like a hot needle in his neck area.  This makes it difficult to work out.  He did lose 60lbs last year.  Since then his pain has been worse.  There is a history of slip discs in his family.      Relevant past medical, surgical, family and social history reviewed and updated as indicated. Interim medical history since our last visit reviewed. Allergies and medications reviewed and updated.  Review of Systems  Musculoskeletal:  Positive for back pain.    Per HPI unless specifically indicated above     Objective:    BP 127/76 (BP Location: Left Arm, Patient Position: Sitting, Cuff Size: Large)   Pulse 86   Temp 98.1 F (36.7 C) (Oral)   Ht 6' 0.05 (1.83 m)   Wt (!) 353 lb (160.1 kg)   SpO2 97%   BMI 47.81 kg/m   Wt Readings from Last 3 Encounters:  12/28/23 (!) 353 lb (160.1 kg)  11/23/23 (!) 347 lb 12.8 oz (157.8 kg)  09/19/18 (!) 342 lb (155.1 kg)    Physical Exam Vitals and nursing note reviewed.  Constitutional:      General: He is not in acute distress.    Appearance: Normal appearance. He is obese. He is not ill-appearing, toxic-appearing or diaphoretic.  HENT:     Head: Normocephalic.     Right Ear: External ear normal.     Left Ear: External ear normal.     Nose: Nose normal. No congestion or rhinorrhea.     Mouth/Throat:     Mouth:  Mucous membranes are moist.  Eyes:     General:        Right eye: No discharge.        Left eye: No discharge.     Extraocular Movements: Extraocular movements intact.     Conjunctiva/sclera: Conjunctivae normal.     Pupils: Pupils are equal, round, and reactive to light.  Cardiovascular:     Rate and Rhythm: Normal rate and regular rhythm.     Heart sounds: No murmur heard. Pulmonary:     Effort: Pulmonary effort is normal. No respiratory distress.     Breath sounds: Normal breath sounds. No wheezing, rhonchi or rales.  Abdominal:     General: Abdomen is flat. Bowel sounds are normal.  Musculoskeletal:     Cervical back: Normal range of motion and neck supple.  Skin:    General: Skin is warm and dry.     Capillary Refill: Capillary refill takes less than 2 seconds.  Neurological:     General: No focal deficit present.     Mental Status: He  is alert and oriented to person, place, and time.  Psychiatric:        Mood and Affect: Mood normal.        Behavior: Behavior normal.        Thought Content: Thought content normal.        Judgment: Judgment normal.     Results for orders placed or performed in visit on 11/23/23  Comp Met (CMET)   Collection Time: 11/23/23  9:25 AM  Result Value Ref Range   Glucose 92 70 - 99 mg/dL   BUN 18 6 - 20 mg/dL   Creatinine, Ser 9.14 0.76 - 1.27 mg/dL   eGFR 884 >40 fO/fpw/8.26   BUN/Creatinine Ratio 21 (H) 9 - 20   Sodium 140 134 - 144 mmol/L   Potassium 4.6 3.5 - 5.2 mmol/L   Chloride 103 96 - 106 mmol/L   CO2 23 20 - 29 mmol/L   Calcium 9.4 8.7 - 10.2 mg/dL   Total Protein 6.7 6.0 - 8.5 g/dL   Albumin 4.4 4.1 - 5.1 g/dL   Globulin, Total 2.3 1.5 - 4.5 g/dL   Bilirubin Total 0.4 0.0 - 1.2 mg/dL   Alkaline Phosphatase 73 47 - 123 IU/L   AST 19 0 - 40 IU/L   ALT 36 0 - 44 IU/L  Lipid panel   Collection Time: 11/23/23  9:25 AM  Result Value Ref Range   Cholesterol, Total 178 100 - 199 mg/dL   Triglycerides 861 0 - 149 mg/dL   HDL  37 (L) >60 mg/dL   VLDL Cholesterol Cal 25 5 - 40 mg/dL   LDL Chol Calc (NIH) 883 (H) 0 - 99 mg/dL   Chol/HDL Ratio 4.8 0.0 - 5.0 ratio  HgB A1c   Collection Time: 11/23/23  9:25 AM  Result Value Ref Range   Hgb A1c MFr Bld 5.2 4.8 - 5.6 %   Est. average glucose Bld gHb Est-mCnc 103 mg/dL  CBC w/Diff   Collection Time: 11/23/23  9:25 AM  Result Value Ref Range   WBC 9.9 3.4 - 10.8 x10E3/uL   RBC 4.68 4.14 - 5.80 x10E6/uL   Hemoglobin 14.4 13.0 - 17.7 g/dL   Hematocrit 55.6 62.4 - 51.0 %   MCV 95 79 - 97 fL   MCH 30.8 26.6 - 33.0 pg   MCHC 32.5 31.5 - 35.7 g/dL   RDW 86.9 88.3 - 84.5 %   Platelets 324 150 - 450 x10E3/uL   Neutrophils 63 Not Estab. %   Lymphs 28 Not Estab. %   Monocytes 7 Not Estab. %   Eos 1 Not Estab. %   Basos 1 Not Estab. %   Neutrophils Absolute 6.3 1.4 - 7.0 x10E3/uL   Lymphocytes Absolute 2.8 0.7 - 3.1 x10E3/uL   Monocytes Absolute 0.7 0.1 - 0.9 x10E3/uL   EOS (ABSOLUTE) 0.1 0.0 - 0.4 x10E3/uL   Basophils Absolute 0.1 0.0 - 0.2 x10E3/uL   Immature Granulocytes 0 Not Estab. %   Immature Grans (Abs) 0.0 0.0 - 0.1 x10E3/uL  Allergen Profile, Basic Food ([facility])   Collection Time: 11/23/23  9:25 AM  Result Value Ref Range   Class Description Allergens Comment    Milk IgE <0.10 Class 0 kU/L   Codfish IgE <0.10 Class 0 kU/L   Wheat IgE <0.10 Class 0 kU/L   Allergen Corn, IgE <0.10 Class 0 kU/L   Peanut IgE <0.10 Class 0 kU/L   Soybean IgE <0.10 Class 0 kU/L   Shrimp IgE <  0.10 Class 0 kU/L   Pork IgE <0.10 Class 0 kU/L   Beef IgE <0.10 Class 0 kU/L   [id number] Mussel <0.10 Class 0 kU/L   Tuna <0.10 Class 0 kU/L   Allergen Salmon IgE <0.10 Class 0 kU/L   Chocolate/Cacao IgE <0.10 Class 0 kU/L   Egg, Whole IgE <0.10 Class 0 kU/L  HIV Antibody (routine testing w rflx)   Collection Time: 11/23/23  9:25 AM  Result Value Ref Range   HIV Screen 4th Generation wRfx Non Reactive Non Reactive  Hepatitis C antibody   Collection Time: 11/23/23  9:25 AM   Result Value Ref Range   Hep C Virus Ab Non Reactive Non Reactive      Assessment & Plan:   Problem List Items Addressed This Visit       Other   BMI 45.0-49.9, adult (HCC) - Primary   Recommended eating smaller high protein, low fat meals more frequently and exercising 30 mins a day 5 times a week with a goal of 10-15lb weight loss in the next 3 months.       Other Visit Diagnoses       Chronic bilateral low back pain without sciatica       Will refer to Ortho for evaluation and management. Patient had an injury last year. May need physical therapy for management of pain.   Relevant Orders   Ambulatory referral to Orthopedic Surgery        Follow up plan: Return in about 3 months (around 03/27/2024) for Back pain .      "

## 2024-01-10 ENCOUNTER — Ambulatory Visit

## 2024-01-10 ENCOUNTER — Ambulatory Visit (INDEPENDENT_AMBULATORY_CARE_PROVIDER_SITE_OTHER)

## 2024-01-10 VITALS — BP 111/78 | HR 89 | Ht 72.5 in | Wt 353.8 lb

## 2024-01-10 DIAGNOSIS — M25511 Pain in right shoulder: Secondary | ICD-10-CM | POA: Diagnosis not present

## 2024-01-10 DIAGNOSIS — M545 Low back pain, unspecified: Secondary | ICD-10-CM | POA: Diagnosis not present

## 2024-01-10 DIAGNOSIS — G8929 Other chronic pain: Secondary | ICD-10-CM | POA: Diagnosis not present

## 2024-01-10 DIAGNOSIS — M898X1 Other specified disorders of bone, shoulder: Secondary | ICD-10-CM

## 2024-01-10 NOTE — Progress Notes (Signed)
 "  Office Visit Note   Patient: Stephen Ross           Date of Birth: 11-03-1985           MRN: [id number] Visit Date: 01/10/2024              Requested by: Melvin Pao, NP [address] [place],  [place] [address] PCP: Melvin Pao, NP   Assessment & Plan: Visit Diagnoses:  1. Chronic bilateral low back pain without sciatica   2. Pain of right scapula     Plan: For the back, Recommend MRI of the lumbar spine for further evaluation and follow up after MRI. For the right shoulder, patient has strain of scapular musculature. Recommended rest and ince, OTC pain medication, and physical therapy. Referral for PT given.  Orders:  Orders Placed This Encounter  Procedures   DG Lumbar Spine 2-3 Views   DG Scapula Right   DG Thoracic Spine 2 View   MR Lumbar Spine Wo Contrast   Ambulatory referral to Physical Therapy   No orders of the defined types were placed in this encounter.    Subjective: Chief Complaint: Lower back pain, right scapular pain  HPI  [name] is a 39 y.o. male who presents for evaluation of low back pain. The patient has had recurrent self limited episodes of low back pain in the past. Symptoms have been present for 2 years and are unchanged.  Onset was related to / precipitated by a work injury. The pain is located in the across the lower back and radiates to the right lower leg, left lower leg. The pain is described as aching and occurs in the afternoon. Symptoms are exacerbated by sitting and walking. Symptoms are improved by nothing. He has tingling in the right leg and tingling in the left leg associated with the back pain. Previous history of symptoms: Had same pain last year after the work injury. Also complains of right scapular pain that started with the same work injury. Pain occurs when he pushes up with his right arm.   Objective: Vital Signs: BP 111/78 (Cuff Size: Large)   Pulse 89   Ht 6' 0.5 (1.842 m)   Wt (!) 353 lb 12.8 oz (160.5  kg)   BMI 47.32 kg/m   Physical Exam Gen: Alert, No Acute Distress Lumbar spine: Skin intact, no erythema or induration noted. Full range of motion without pain, no tenderness, no spasm, no curvature. Normal reflexes, gait, strength and negative straight-leg raise. Straight leg raise: negative at 60 degrees bilaterally. Right shoulder: Skin intact, no erythema or induration. No evidence of scapular winging. Full shoulder ROM. NTTP.  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 Social History   Occupational History   Not on file  Tobacco  Use   Smoking status: Never   Smokeless tobacco: Never  Vaping Use   Vaping status: Never Used  Substance and Sexual Activity   Alcohol use: Never   Drug use: Never   Sexual activity: Yes   Current Outpatient Medications  Medication Instructions   ibuprofen (ADVIL) 800 mg, Every 8 hours PRN   Allergies as of 01/10/2024   (No Known Allergies)   "

## 2024-01-12 ENCOUNTER — Telehealth: Payer: Self-pay

## 2024-01-12 NOTE — Telephone Encounter (Signed)
 Lvm for pt to cb to make post Mri appt with Dr. Mariah

## 2024-01-13 NOTE — Telephone Encounter (Signed)
 Left 2nd voice to make MRI f/u

## 2024-01-14 ENCOUNTER — Ambulatory Visit: Payer: Self-pay

## 2024-01-19 ENCOUNTER — Inpatient Hospital Stay: Admission: RE | Admit: 2024-01-19 | Discharge: 2024-01-19 | Disposition: A | Payer: Self-pay | Source: Ambulatory Visit

## 2024-01-19 DIAGNOSIS — G8929 Other chronic pain: Secondary | ICD-10-CM

## 2024-01-21 ENCOUNTER — Ambulatory Visit: Payer: Self-pay

## 2024-01-21 VITALS — BP 114/74 | HR 76 | Ht 72.5 in | Wt 353.0 lb

## 2024-01-21 DIAGNOSIS — G8929 Other chronic pain: Secondary | ICD-10-CM

## 2024-01-21 DIAGNOSIS — M5441 Lumbago with sciatica, right side: Secondary | ICD-10-CM

## 2024-01-21 DIAGNOSIS — M5442 Lumbago with sciatica, left side: Secondary | ICD-10-CM

## 2024-01-21 MED ORDER — IBUPROFEN 800 MG PO TABS: 800.0000 mg | ORAL_TABLET | Freq: Three times a day (TID) | ORAL | 3 refills | Status: AC | PRN

## 2024-01-21 NOTE — Progress Notes (Signed)
 "  Office Visit Note   Patient: Stephen Ross           Date of Birth: 08/03/1985           MRN: [id number] Visit Date: 01/21/2024              Requested by: Melvin Pao, NP [address] [place],  [place] [address] PCP: Melvin Pao, NP   Assessment & Plan: Visit Diagnoses:  1. Chronic bilateral low back pain with bilateral sciatica     Plan: Natural history and expected course discussed. Questions answered. Recommend alternating between ice and heat to affected area as needed for local pain relief. NSAIDs per medication orders. Recommend PT and referral given. Follow-up in 3 month.  Orders:  Orders Placed This Encounter  Procedures   Ambulatory referral to Physical Therapy   Meds ordered this encounter  Medications   ibuprofen  (ADVIL ) 800 MG tablet    Sig: Take 1 tablet (800 mg total) by mouth every 8 (eight) hours as needed.    Dispense:  30 tablet    Refill:  3     Subjective: Chief Complaint: Lower back pain  HPI  Stephen Ross is a 39 y.o. male who presents for a follow up appointment for low back pain. Patient's symptoms are unchanged since last visit. The pain is located in the across the lower back and radiating to bilateral leg(s).   Objective: Vital Signs: BP 114/74   Pulse 76   Ht 6' 0.5 (1.842 m)   Wt (!) 353 lb (160.1 kg)   BMI 47.22 kg/m   Physical Exam Gen: Alert, No Acute Distress  MRI personally reviewed by me; no signs of nerve compression  FINDINGS:   BONES AND ALIGNMENT: 5 lumbar type vertebrae. Normal alignment. Normal vertebral body heights. Bone marrow signal is unremarkable.   SPINAL CORD: The conus medullaris terminates at L1 and is normal in signal.   SOFT TISSUES: No paraspinal mass.   DISC LEVELS: Disc height and hydration are maintained throughout the lumbar spine with exception of disc desiccation and early disc height loss at  L5-S1. T12-L1: Negative. L1-L2: Negative. L2-L3: Negative. L3-L4: Negative. L4-L5: Minimal disc bulging without stenosis. L5-S1: Mild disc bulging eccentric to the right without stenosis.   IMPRESSION: 1. Early lower lumbar spondylosis without stenosis.   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 Social History   Occupational History   Not on file  Tobacco Use   Smoking status: Never   Smokeless tobacco: Never  Vaping Use   Vaping status: Never Used  Substance and Sexual Activity   Alcohol use: Never   Drug use: Never   Sexual activity: Yes   Current Outpatient Medications  Medication Instructions   ibuprofen  (ADVIL ) 800 mg, Every 8 hours PRN   ibuprofen  (ADVIL ) 800 mg, Oral, Every 8 hours PRN  Allergies as of 01/21/2024   (No Known Allergies)   "

## 2024-03-28 ENCOUNTER — Ambulatory Visit: Admitting: Nurse Practitioner

## 2024-04-20 ENCOUNTER — Ambulatory Visit
